# Patient Record
Sex: Female | Born: 1995 | Hispanic: No | Marital: Single | Smoking: Never smoker
Health system: Southern US, Community
[De-identification: ages and names within clinical notes are randomized; demographics above are authoritative.]

## PROBLEM LIST (undated history)

## (undated) DIAGNOSIS — F329 Major depressive disorder, single episode, unspecified: Secondary | ICD-10-CM

## (undated) DIAGNOSIS — F419 Anxiety disorder, unspecified: Secondary | ICD-10-CM

## (undated) DIAGNOSIS — R630 Anorexia: Secondary | ICD-10-CM

## (undated) DIAGNOSIS — F32A Depression, unspecified: Secondary | ICD-10-CM

---

## 2016-01-27 ENCOUNTER — Emergency Department
Admission: EM | Admit: 2016-01-27 | Discharge: 2016-01-27 | Disposition: A | Discharge status: Home / Self Care | Payer: Managed Care, Other (non HMO) | Attending: Emergency Medicine | Admitting: Emergency Medicine

## 2016-01-27 ENCOUNTER — Emergency Department: Payer: Managed Care, Other (non HMO)

## 2016-01-27 ENCOUNTER — Encounter: Payer: Self-pay | Admitting: Emergency Medicine

## 2016-01-27 DIAGNOSIS — B349 Viral infection, unspecified: Secondary | ICD-10-CM | POA: Diagnosis not present

## 2016-01-27 DIAGNOSIS — R509 Fever, unspecified: Secondary | ICD-10-CM | POA: Diagnosis present

## 2016-01-27 HISTORY — DX: Anorexia: R63.0

## 2016-01-27 HISTORY — DX: Major depressive disorder, single episode, unspecified: F32.9

## 2016-01-27 HISTORY — DX: Depression, unspecified: F32.A

## 2016-01-27 HISTORY — DX: Anxiety disorder, unspecified: F41.9

## 2016-01-27 LAB — COMPREHENSIVE METABOLIC PANEL
ALT: 15 U/L (ref 14–54)
ANION GAP: 9 (ref 5–15)
AST: 22 U/L (ref 15–41)
Albumin: 4.4 g/dL (ref 3.5–5.0)
Alkaline Phosphatase: 69 U/L (ref 38–126)
BUN: 10 mg/dL (ref 6–20)
CHLORIDE: 102 mmol/L (ref 101–111)
CO2: 23 mmol/L (ref 22–32)
Calcium: 9.4 mg/dL (ref 8.9–10.3)
Creatinine, Ser: 0.92 mg/dL (ref 0.44–1.00)
GFR calc Af Amer: >60 mL/min (ref >60)
Glucose, Bld: 131 mg/dL — ABNORMAL HIGH (ref 65–99)
POTASSIUM: 3.5 mmol/L (ref 3.5–5.1)
Sodium: 134 mmol/L — ABNORMAL LOW (ref 135–145)
Total Bilirubin: 1.2 mg/dL (ref 0.3–1.2)
Total Protein: 7.9 g/dL (ref 6.5–8.1)

## 2016-01-27 LAB — CBC
HEMATOCRIT: 42.9 % (ref 35.0–47.0)
HEMOGLOBIN: 14.7 g/dL (ref 12.0–16.0)
MCH: 28.7 pg (ref 26.0–34.0)
MCHC: 34.2 g/dL (ref 32.0–36.0)
MCV: 84 fL (ref 80.0–100.0)
Platelets: 210 10*3/uL (ref 150–440)
RBC: 5.1 MIL/uL (ref 3.80–5.20)
RDW: 12.9 % (ref 11.5–14.5)
WBC: 18.4 10*3/uL — AB (ref 3.6–11.0)

## 2016-01-27 LAB — URINALYSIS COMPLETE WITH MICROSCOPIC (ARMC ONLY)
Glucose, UA: NEGATIVE mg/dL
HGB URINE DIPSTICK: NEGATIVE
Nitrite: NEGATIVE
PH: 6 (ref 5.0–8.0)
Protein, ur: >500 mg/dL — AB
SPECIFIC GRAVITY, URINE: 1.035 — AB (ref 1.005–1.030)

## 2016-01-27 LAB — INFLUENZA PANEL BY PCR (TYPE A & B)
H1N1 flu by pcr: NOT DETECTED
INFLAPCR: NEGATIVE
INFLBPCR: NEGATIVE

## 2016-01-27 LAB — MONONUCLEOSIS SCREEN: Mono Screen: NEGATIVE

## 2016-01-27 LAB — PREGNANCY, URINE: Preg Test, Ur: NEGATIVE

## 2016-01-27 LAB — POCT RAPID STREP A: Streptococcus, Group A Screen (Direct): NEGATIVE

## 2016-01-27 LAB — LIPASE, BLOOD: LIPASE: 35 U/L (ref 11–51)

## 2016-01-27 MED ORDER — KETOROLAC TROMETHAMINE 30 MG/ML IJ SOLN
30.0000 mg | Freq: Once | INTRAMUSCULAR | Status: AC
  Administered 2016-01-27: 30 mg via INTRAVENOUS
  Filled 2016-01-27: qty 1

## 2016-01-27 MED ORDER — SODIUM CHLORIDE 0.9 % IV BOLUS (SEPSIS)
1000.0000 mL | Freq: Once | INTRAVENOUS | Status: AC
  Administered 2016-01-27: 1000 mL via INTRAVENOUS

## 2016-01-27 MED ORDER — PENTAFLUOROPROP-TETRAFLUOROETH EX AERO
INHALATION_SPRAY | CUTANEOUS | Status: AC
  Filled 2016-01-27: qty 30

## 2016-01-27 MED ORDER — IBUPROFEN 600 MG PO TABS
600.0000 mg | ORAL_TABLET | Freq: Once | ORAL | Status: AC
  Administered 2016-01-27: 600 mg via ORAL
  Filled 2016-01-27: qty 1

## 2016-01-27 MED ORDER — ONDANSETRON 4 MG PO TBDP
4.0000 mg | ORAL_TABLET | Freq: Once | ORAL | Status: AC | PRN
  Administered 2016-01-27: 4 mg via ORAL
  Filled 2016-01-27: qty 1

## 2016-01-27 MED ORDER — AZITHROMYCIN 250 MG PO TABS: ORAL_TABLET | ORAL | 0 refills | Status: AC

## 2016-01-27 MED ORDER — PENTAFLUOROPROP-TETRAFLUOROETH EX AERO
INHALATION_SPRAY | CUTANEOUS | Status: DC | PRN
  Administered 2016-01-27: 30 via TOPICAL

## 2016-01-27 NOTE — ED Notes (Signed)
MD Quigley at bedside. 

## 2016-01-27 NOTE — Discharge Instructions (Signed)
Return immediately if he develops an increasing headache, light bothers her eyes, persistent vomiting, midline neck stiffness, unusual rash or lesion, or any other new concerns.  Please take ibuprofen and/or Tylenol around-the-clock for body aches and fever. Drink plenty of fluids and take a multivitamin.  Please return immediately if condition worsens. Please contact her primary physician or the physician you were given for referral. If you have any specialist physicians involved in her treatment and plan please also contact them. Thank you for using [facility].

## 2016-01-27 NOTE — ED Triage Notes (Signed)
Pt says she began feeling bad last night with vomiting "bile" and fever; max temp at her dorm was 102.1; having general body aches, generalized abd pain; neck pain (able to tip chin to chest), general body aches; c/o sore throat; denies cough; pt tearful in triage; ibuprofen at 2pm, tylenol flu at 7pm

## 2016-01-27 NOTE — ED Provider Notes (Signed)
Time Seen: Approximately 2115 I have reviewed the triage notes  Chief Complaint: Fever; Nausea; Generalized Body Aches; and Abdominal Pain   History of Present Illness: Elaine Beard is a 20 y.o. female *patient presents with feeling bad the previous night and vomited up times one and some biliary type material. She had some diffuse body aches and checked her temperature at the dorm room and was 102.1. She's describing some general body aches. She denies any abdominal pain to this historian. She does state some neck pain and points mainly to the right lateral surface. She states her symptoms really started with a sore throat. She denies any productive cough or wheezing. She denies any flank pain dysuria or hematuria. Niacin any unusual rashes or lesions. No recent travel. No obvious tick bite or exposure tickborne disease. She states that there is numerous people ill on her for. She states the individual lives across the hall has mononucleosis.  Past Medical History:  Diagnosis Date  . Anorexia   . Anxiety   . Depression     There are no active problems to display for this patient.   History reviewed. No pertinent surgical history.  History reviewed. No pertinent surgical history.  Current Outpatient Rx  . Order #: [id number] Class: Historical Med  . Order #: [id number] Class: Historical Med  . Order #: [id number] Class: Print    Allergies:  Review of patient's allergies indicates no known allergies.  Family History: History reviewed. No pertinent family history.  Social History: Social History  Substance Use Topics  . Smoking status: Never Smoker  . Smokeless tobacco: Never Used  . Alcohol use Yes     Review of Systems:   10 point review of systems was performed and was otherwise negative:  Constitutional: Fever max is 102.1 Eyes: No visual disturbances. No photophobia ENT: Sore throat without any ear pain  Cardiac: No chest pain Respiratory: No shortness of  breath, wheezing, or stridor Abdomen: No current abdominal pain, mild nausea No diarrhea Endocrine: No weight loss, No night sweats Extremities: No peripheral edema, cyanosis Skin: No rashes, easy bruising Neurologic: No focal weakness, trouble with speech or swollowing. Patient denies any significant headache Urologic: No dysuria, Hematuria, or urinary frequency   Physical Exam:  ED Triage Vitals  Enc Vitals Group     BP 01/27/16 2003 120/68     Pulse Rate 01/27/16 2003 (!) 102     Resp 01/27/16 2003 20     Temp 01/27/16 2003 (!) 101.6 F (38.7 C)     Temp Source 01/27/16 2003 Oral     SpO2 01/27/16 2003 100 %     Weight 01/27/16 2004 160 lb (72.6 kg)     Height 01/27/16 2004 5\' 10"  (1.778 m)     Head Circumference --      Peak Flow --      Pain Score 01/27/16 2004 8     Pain Loc --      Pain Edu? --      Excl. in GC? --     General: Awake , Alert , and Oriented times 3; GCS 15 Head: Normal cephalic , atraumatic Eyes: Pupils equal , round, reactive to light. No photophobia Nose/Throat: No nasal drainage, patent upper airway with enlarged tonsils and some mild exudate bilaterally with a midline uvula and no peritonsillar abscesses. Neck: Supple, Full range of motion, No anterior adenopathy or palpable thyroid masses. No meningeal signs Lungs: Clear to ascultation without wheezes , rhonchi, or rales Heart: Regular  rate, regular rhythm without murmurs , gallops , or rubs Abdomen: Soft, non tender without rebound, guarding , or rigidity; bowel sounds positive and symmetric in all 4 quadrants. No organomegaly .        Extremities: 2 plus symmetric pulses. No edema, clubbing or cyanosis Neurologic: normal ambulation, Motor symmetric without deficits, sensory intact Skin: warm, dry, no rashes   Labs:   All laboratory work was reviewed including any pertinent negatives or positives listed below:  Labs Reviewed  COMPREHENSIVE METABOLIC PANEL - Abnormal; Notable for the  following:       Result Value   Sodium 134 (*)    Glucose, Bld 131 (*)    All other components within normal limits  CBC - Abnormal; Notable for the following:    WBC 18.4 (*)    All other components within normal limits  URINALYSIS COMPLETEWITH MICROSCOPIC ([facility] ONLY) - Abnormal; Notable for the following:    Color, Urine AMBER (*)    APPearance CLEAR (*)    Bilirubin Urine 2+ (*)    Ketones, ur 1+ (*)    Specific Gravity, Urine 1.035 (*)    Protein, ur >500 (*)    Leukocytes, UA TRACE (*)    Bacteria, UA FEW (*)    Squamous Epithelial / LPF 0-5 (*)    All other components within normal limits  CULTURE, GROUP A STREP ([facility])  LIPASE, BLOOD  INFLUENZA PANEL BY PCR (TYPE A & B, H1N1)  MONONUCLEOSIS SCREEN  PREGNANCY, URINE  POCT RAPID STREP A  Patient's influenza and viral testing is negative   Radiology: * Patient's chest x-ray is pending per radiology that is looked at the films there is some mild streaking posteriorly but no obvious focal infiltrate  I personally reviewed the radiologic studies    ED Course:  Patient's stay here showed symptomatic improvement with IV fluid bolus. She had some mild nausea on arrival which was cleared with Zofran. I felt was unlikely the patient had meningitis and reviewed the symptoms with her at the bedside and we both agree that a lumbar puncture is not necessary at this time though advised her to have a low threshold to return to the emergency department if she develops a global headache, photophobia, etc. Source of her fever is unknown most likely a viral illness that she does have some mild exudates on both of her tonsils and her symptoms started with a sore throat. Since she has some mild streaking posteriorly on her x-ray I felt we could start her on a Zithromax which will cover both for early community acquired pneumonia and pharyngitis. She was advised take over-the-counter Tylenol and/or Motrin for pain and fever. Clinical Course      Assessment: Viral syndrome  Final Clinical Impression  Final diagnoses:  Viral illness     Plan:  Outpatient Patient was advised to return immediately if condition worsens. Patient was advised to follow up with their primary care physician or other specialized physicians involved in their outpatient care. The patient and/or family member/power of attorney had laboratory results reviewed at the bedside. All questions and concerns were addressed and appropriate discharge instructions were distributed by the nursing staff.             Jennye MoccasinBrian S Quigley, MD 01/27/16 [phone]

## 2016-01-27 NOTE — ED Notes (Signed)
Reviewed d/c instructions, follow-up care and prescription with pt. Pt verbalized understanding 

## 2016-01-27 NOTE — ED Notes (Addendum)
PT c/o generalized abdominal pain, neck pain, head ache, weakness, dizziness, back pain, sore throat, N/V and fever/chills. Pt denies diarrhea. PT reports tmax of 102.1 at home.

## 2016-01-29 LAB — CULTURE, GROUP A STREP (THRC)

## 2017-08-12 ENCOUNTER — Emergency Department: Payer: Managed Care, Other (non HMO)

## 2017-08-12 ENCOUNTER — Other Ambulatory Visit: Payer: Self-pay

## 2017-08-12 ENCOUNTER — Emergency Department
Admission: EM | Admit: 2017-08-12 | Discharge: 2017-08-12 | Disposition: A | Discharge status: Home / Self Care | Payer: Managed Care, Other (non HMO) | Attending: Emergency Medicine | Admitting: Emergency Medicine

## 2017-08-12 DIAGNOSIS — R102 Pelvic and perineal pain: Secondary | ICD-10-CM | POA: Insufficient documentation

## 2017-08-12 LAB — COMPREHENSIVE METABOLIC PANEL
ALT: 19 U/L (ref 14–54)
AST: 24 U/L (ref 15–41)
Albumin: 4.5 g/dL (ref 3.5–5.0)
Alkaline Phosphatase: 73 U/L (ref 38–126)
Anion gap: 6 (ref 5–15)
BUN: 8 mg/dL (ref 6–20)
CO2: 25 mmol/L (ref 22–32)
Calcium: 9.1 mg/dL (ref 8.9–10.3)
Chloride: 106 mmol/L (ref 101–111)
Creatinine, Ser: 0.74 mg/dL (ref 0.44–1.00)
GFR calc Af Amer: >60 mL/min (ref >60)
GFR calc non Af Amer: >60 mL/min (ref >60)
Glucose, Bld: 94 mg/dL (ref 65–99)
Potassium: 3.9 mmol/L (ref 3.5–5.1)
Sodium: 137 mmol/L (ref 135–145)
Total Bilirubin: 0.3 mg/dL (ref 0.3–1.2)
Total Protein: 7 g/dL (ref 6.5–8.1)

## 2017-08-12 LAB — CBC WITH DIFFERENTIAL/PLATELET
Basophils Absolute: 0.1 10*3/uL (ref 0–0.1)
Basophils Relative: 1 %
Eosinophils Absolute: 0.2 10*3/uL (ref 0–0.7)
Eosinophils Relative: 2 %
HEMATOCRIT: 38.9 % (ref 35.0–47.0)
HEMOGLOBIN: 13 g/dL (ref 12.0–16.0)
Lymphocytes Relative: 24 %
Lymphs Abs: 2 10*3/uL (ref 1.0–3.6)
MCH: 26.8 pg (ref 26.0–34.0)
MCHC: 33.3 g/dL (ref 32.0–36.0)
MCV: 80.6 fL (ref 80.0–100.0)
MONOS PCT: 8 %
Monocytes Absolute: 0.7 10*3/uL (ref 0.2–0.9)
NEUTROS ABS: 5.5 10*3/uL (ref 1.4–6.5)
Neutrophils Relative %: 65 %
Platelets: 312 10*3/uL (ref 150–440)
RBC: 4.83 MIL/uL (ref 3.80–5.20)
RDW: 13.7 % (ref 11.5–14.5)
WBC: 8.4 10*3/uL (ref 3.6–11.0)

## 2017-08-12 LAB — POCT PREGNANCY, URINE: PREG TEST UR: NEGATIVE

## 2017-08-12 NOTE — ED Provider Notes (Addendum)
[facility] Emergency Department Provider Note  ____________________________________________  Time seen: Approximately 7:18 PM  I have reviewed the triage vital signs and the nursing notes.   HISTORY  Chief Complaint Pelvic Pain    HPI Elaine Beard is a 22 y.o. female with a history of depression and irregular periods who comes to the ED complaining of pelvic pain, worse in the left, worse during her period, no alleviating factors. Her menstrual cycles are irregular in timing and heaviness. Currently she is on her cycle. Pain is moderate in intensity, sharp, nonradiating. No fever or vomiting or other complaints. No abnormal discharge.      Past Medical History:  Diagnosis Date  . Anorexia   . Anxiety   . Depression      There are no active problems to display for this patient.    History reviewed. No pertinent surgical history.   Prior to Admission medications   Medication Sig Start Date End Date Taking? Authorizing Provider  FLUoxetine (PROZAC) 20 MG capsule Take 20 mg by mouth daily.    [provider]  norethindrone-ethinyl estradiol (JUNEL FE,GILDESS FE,LOESTRIN FE) 1-20 MG-MCG tablet Take 1 tablet by mouth daily.    [provider]     Allergies Patient has no known allergies.   No family history on file.  Social History Social History   Tobacco Use  . Smoking status: Never Smoker  . Smokeless tobacco: Never Used  Substance Use Topics  . Alcohol use: Yes    Comment: weekends  . Drug use: No    Review of Systems  Constitutional:   No fever or chills.  ENT:   No sore throat. No rhinorrhea. Cardiovascular:   No chest pain or syncope. Respiratory:   No dyspnea or cough. Gastrointestinal:   positive as above for pelvic pain without vomiting and diarrhea.  Musculoskeletal:   Negative for focal pain or swelling All other systems reviewed and are negative except as documented above in ROS and  HPI.  ____________________________________________   PHYSICAL EXAM:  VITAL SIGNS: ED Triage Vitals  Enc Vitals Group     BP 08/12/17 1758 117/75     Pulse Rate 08/12/17 1758 74     Resp 08/12/17 1758 18     Temp 08/12/17 1758 98.6 F (37 C)     Temp Source 08/12/17 1758 Oral     SpO2 08/12/17 1758 99 %     Weight 08/12/17 1758 155 lb (70.3 kg)     Height 08/12/17 1758  (1.778 m)     Head Circumference --      Peak Flow --      Pain Score 08/12/17 1804 8     Pain Loc --      Pain Edu? --      Excl. in GC? --     Vital signs reviewed, nursing assessments reviewed.   Constitutional:   Alert and oriented. Well appearing and in no distress. Eyes:   Conjunctivae are normal. EOMI.  ENT      Head:   Normocephalic and atraumatic.    Gastrointestinal:   Soft with left lower quadrant tenderness. Non distended. There is no CVA tenderness.  No rebound, rigidity, or guarding.  Musculoskeletal:   Normal range of motion in all extremities. No joint effusions.  No lower extremity tenderness.  No edema. Neurologic:   Normal speech and language.  Motor grossly intact. No acute focal neurologic deficits are appreciated.  Skin:    Skin  is warm, dry and intact. No rash noted.  No petechiae, purpura, or bullae.  ____________________________________________    LABS (pertinent positives/negatives) (all labs ordered are listed, but only abnormal results are displayed) Labs Reviewed  CBC WITH DIFFERENTIAL/PLATELET  COMPREHENSIVE METABOLIC PANEL  POC URINE PREG, ED  POCT PREGNANCY, URINE   ____________________________________________   EKG    ____________________________________________    RADIOLOGY  No results found.  ____________________________________________   PROCEDURES Procedures  ____________________________________________  DIFFERENTIAL DIAGNOSIS   ovarian cyst, endometriosis, uterine fibroid  CLINICAL IMPRESSION / ASSESSMENT AND PLAN / ED  COURSE  Pertinent labs & imaging results that were available during my care of the patient were reviewed by me and considered in my medical decision making (see chart for details).    patient well-appearing no acute distress, low suspicion for STI PID TOA or torsion. Due to her chronic recurrent symptoms, I'll get a pelvic ultrasound today for further evaluation, particularly since she is from out of town, does not have a provider in this area, unable to obtain expeditious follow-up. If there are no severe findings patient will be suitable for discharge home to await follow-up with local gynecology.   ----------------------------------------- 8:33 PM on 08/12/2017 -----------------------------------------  Ultrasound normal. Discharge home to follow up With gynecology. NSAIDs as needed.     ____________________________________________   FINAL CLINICAL IMPRESSION(S) / ED DIAGNOSES    Final diagnoses:  Pelvic pain in female     ED Discharge Orders    None      Portions of this note were generated with dragon dictation software. Dictation errors may occur despite best attempts at proofreading.    Sharman Cheek, MD 08/12/17 [name]    Sharman Cheek, MD 08/12/17 2034

## 2017-08-12 NOTE — ED Triage Notes (Signed)
Pt c/o pelvic pain - she went to urgent care and they brought her to the ED - pt states that she also has issues with her period and has pelvic pain for the last year - she got an IUD last August and the cramping has gotten worse since then - her OB/GYN is concerned that the IUD has "implanted"

## 2017-08-12 NOTE — Discharge Instructions (Addendum)
Your pelvic ultrasound today was normal.  We are unable to identify a cause of your symptoms today. Please follow up with gynecology when able for further evaluation. Take NSAIDS such as ibuprofen or naproxen as needed for pain control.  Ultrasound report:  US Transvaginal Non-ob  Result Date: 08/12/2017 CLINICAL DATA:  Pelvic pain and irregular menstrual cycles. Indwelling IUD. EXAM: TRANSABDOMINAL AND TRANSVAGINAL ULTRASOUND OF PELVIS TECHNIQUE: Both transabdominal and transvaginal ultrasound examinations of the pelvis were performed. Transabdominal technique was performed for global imaging of the pelvis including uterus, ovaries, adnexal regions, and pelvic cul-de-sac. It was necessary to proceed with endovaginal exam following the transabdominal exam to visualize the uterus and ovaries. COMPARISON:  None FINDINGS: Uterus Measurements: 5.9 x 3.5 x 4.6 cm. No fibroids or other mass visualized. Endometrium Thickness: 7 mm.  IUD localizes to the endometrial cavity. Right ovary Measurements: 2.8 x 1.8 x 2.6 cm. Normal appearance/no adnexal mass. Normal follicles present. Left ovary Measurements: 2.5 x 1.4 x 1.5 cm. Normal appearance/no adnexal mass. Normal follicles present. Other findings No abnormal free fluid. IMPRESSION: Normal pelvic ultrasound. IUD appropriately positioned in the endometrial cavity. Electronically Signed   By: Irish Lack M.D.   On: 08/12/2017 20:26   US Pelvis Complete  Result Date: 08/12/2017 CLINICAL DATA:  Pelvic pain and irregular menstrual cycles. Indwelling IUD. EXAM: TRANSABDOMINAL AND TRANSVAGINAL ULTRASOUND OF PELVIS TECHNIQUE: Both transabdominal and transvaginal ultrasound examinations of the pelvis were performed. Transabdominal technique was performed for global imaging of the pelvis including uterus, ovaries, adnexal regions, and pelvic cul-de-sac. It was necessary to proceed with endovaginal exam following the transabdominal exam to visualize the uterus and ovaries.  COMPARISON:  None FINDINGS: Uterus Measurements: 5.9 x 3.5 x 4.6 cm. No fibroids or other mass visualized. Endometrium Thickness: 7 mm.  IUD localizes to the endometrial cavity. Right ovary Measurements: 2.8 x 1.8 x 2.6 cm. Normal appearance/no adnexal mass. Normal follicles present. Left ovary Measurements: 2.5 x 1.4 x 1.5 cm. Normal appearance/no adnexal mass. Normal follicles present. Other findings No abnormal free fluid. IMPRESSION: Normal pelvic ultrasound. IUD appropriately positioned in the endometrial cavity. Electronically Signed   By: Irish Lack M.D.   On: 08/12/2017 20:26

## 2017-08-12 NOTE — ED Notes (Signed)
Informed RN that patient has been roomed and is ready for evaluation.  Patient in NAD at this time and call bell placed within reach.   

## 2018-05-28 ENCOUNTER — Other Ambulatory Visit: Payer: Self-pay | Admitting: Gastroenterology

## 2018-05-28 DIAGNOSIS — R1010 Upper abdominal pain, unspecified: Secondary | ICD-10-CM

## 2018-05-31 ENCOUNTER — Ambulatory Visit
Admission: RE | Admit: 2018-05-31 | Discharge: 2018-05-31 | Disposition: A | Discharge status: Home / Self Care | Payer: Managed Care, Other (non HMO) | Source: Ambulatory Visit | Attending: Gastroenterology | Admitting: Gastroenterology

## 2018-05-31 DIAGNOSIS — R1010 Upper abdominal pain, unspecified: Secondary | ICD-10-CM

## 2021-02-05 IMAGING — US US ABDOMEN LIMITED
1 series · 14 of 25 positions shown · non-contrast
Comparison: None.

CLINICAL DATA: Upper abdominal pain

EXAM:
ULTRASOUND ABDOMEN LIMITED RIGHT UPPER QUADRANT

[Series 1: us abdomen limited · 0.23mm/px · 14 of 35 slices shown]
[im 1/35]
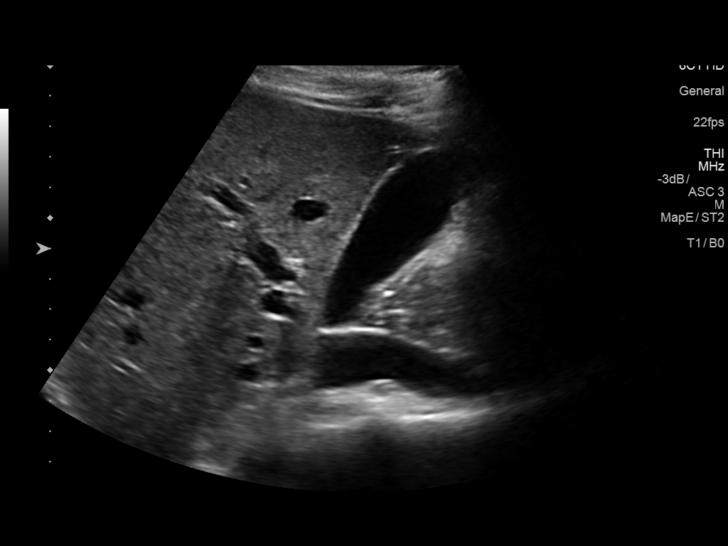
[im 3/35]
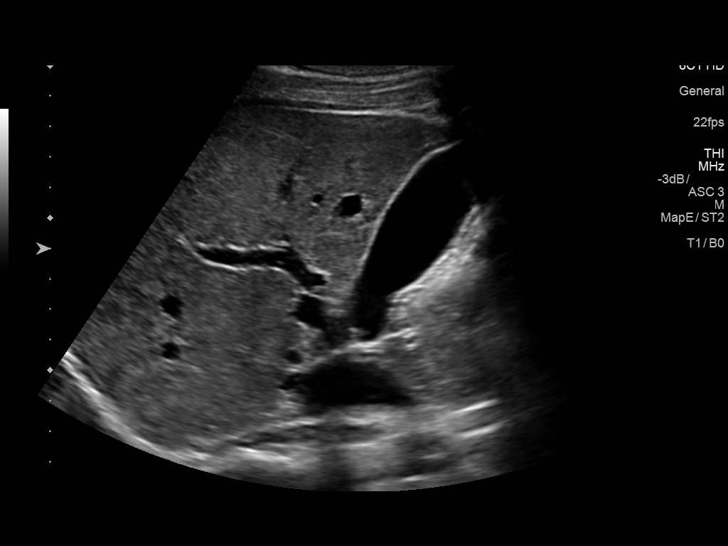
[im 6/35]
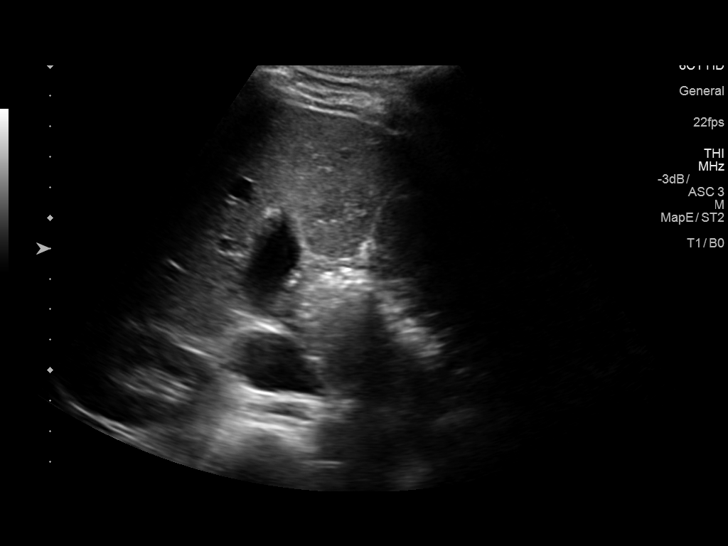
[im 9/35]
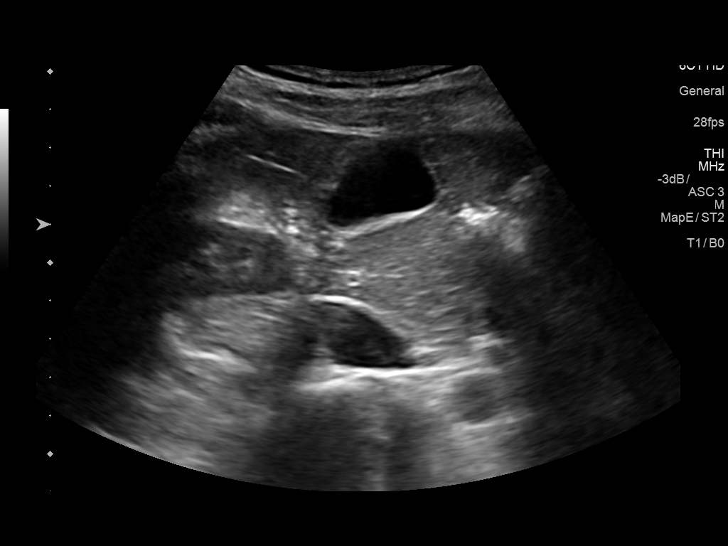
[im 12/35]
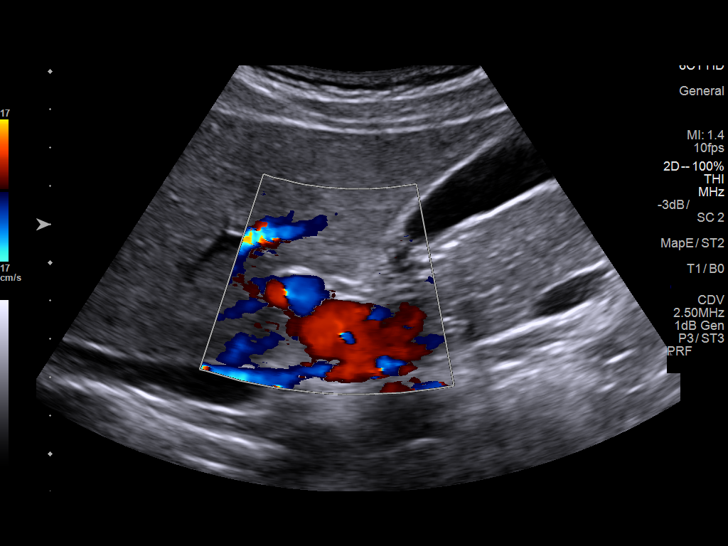
[im 13/35]
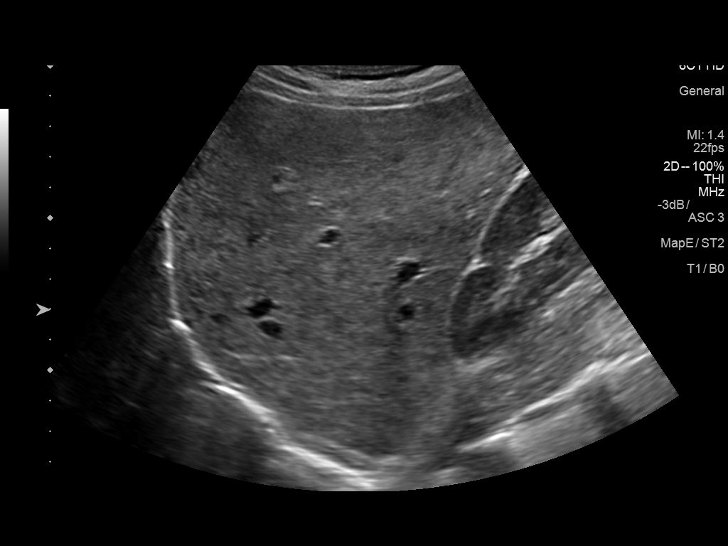
[im 16/35]
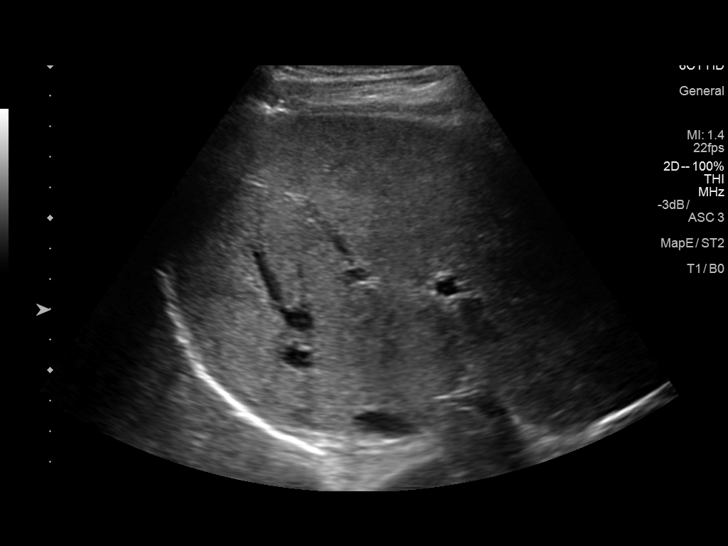
[im 19/35]
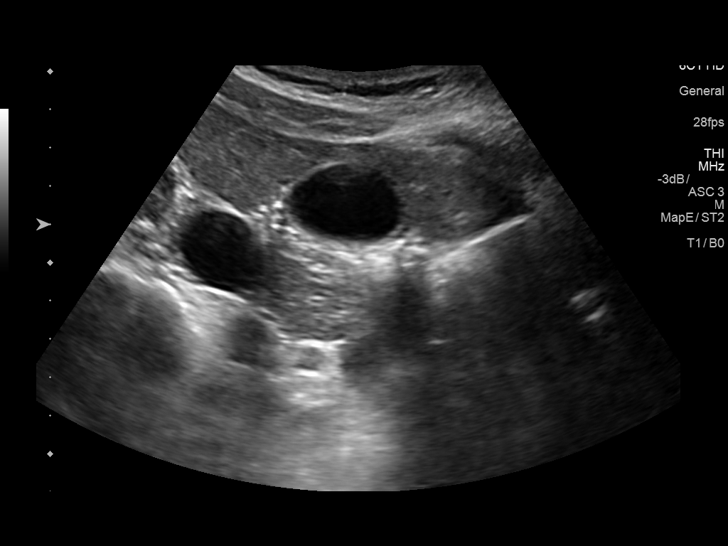
[im 22/35]
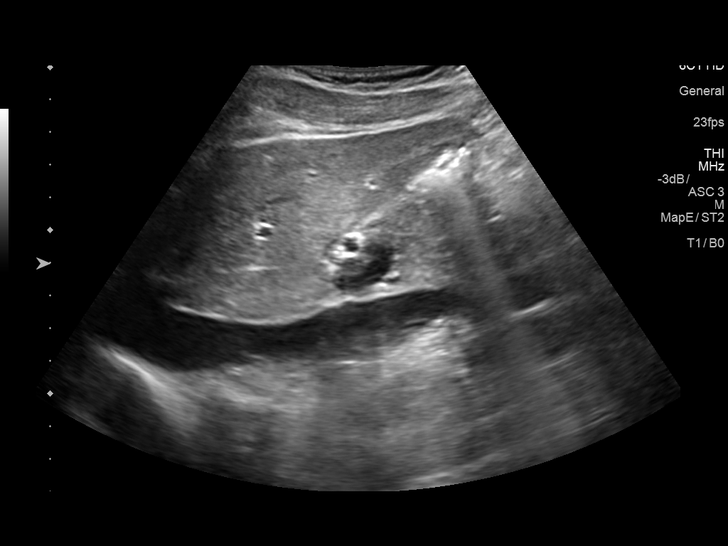
[im 23/35]
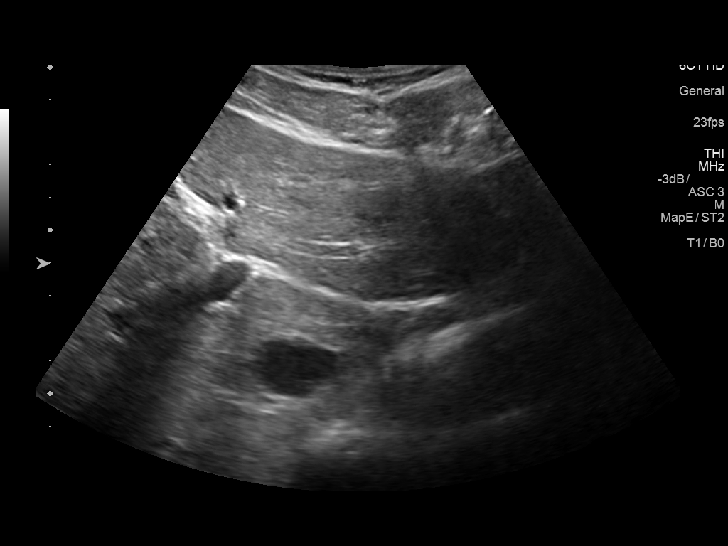
[im 26/35]
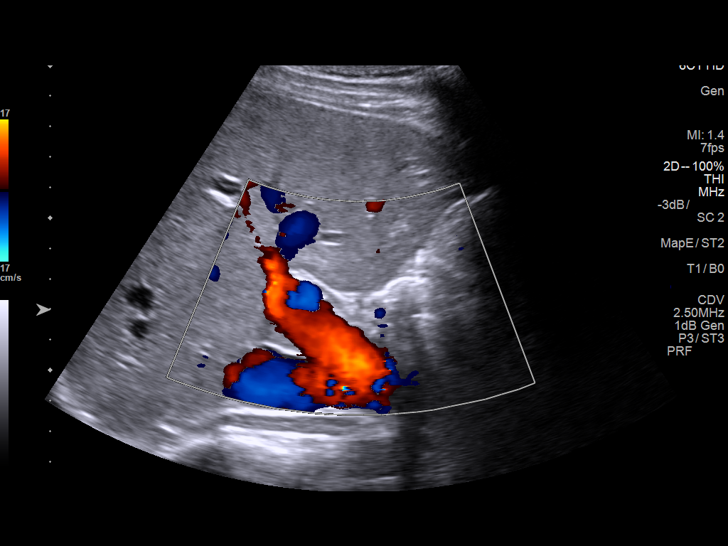
[im 29/35]
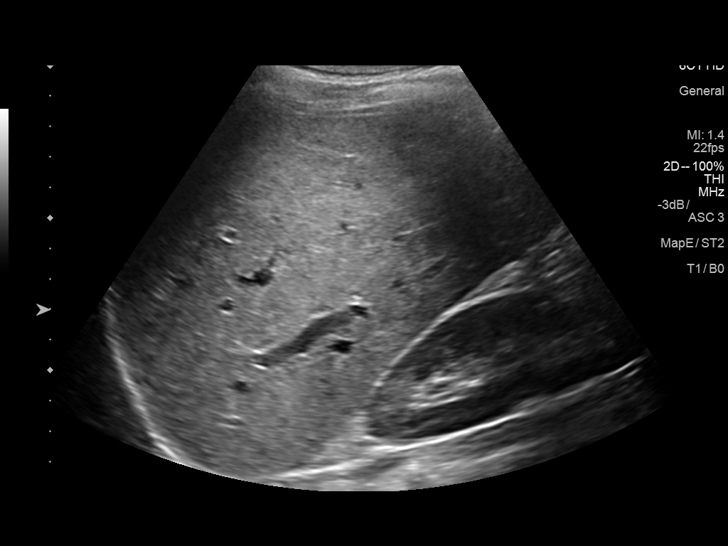
[im 32/35]
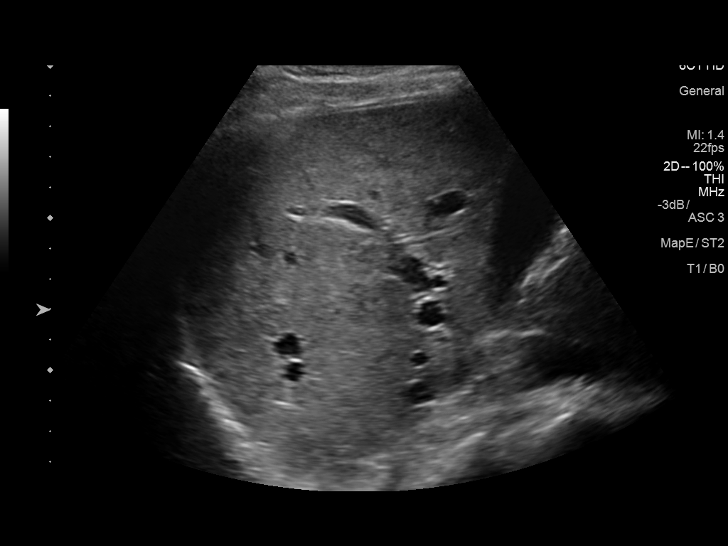
[im 35/35]
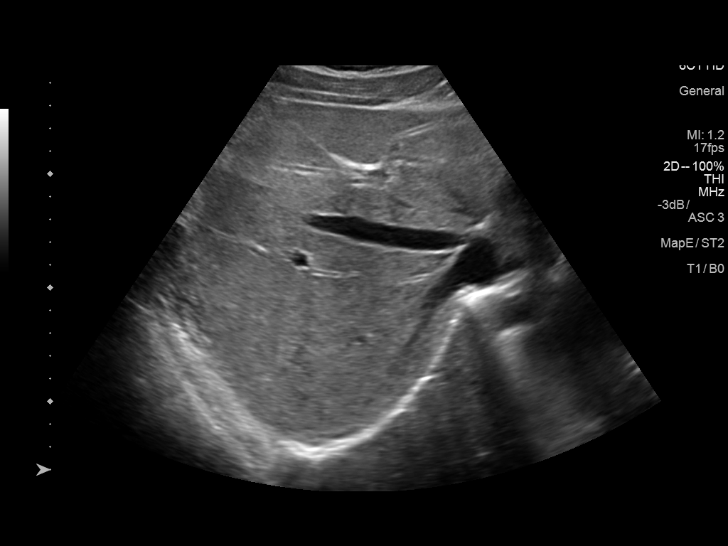

[14 of 25 positions shown; findings below may reference images not displayed]

FINDINGS: Gallbladder:

No gallstones or wall thickening visualized. There is no
pericholecystic fluid. No sonographic Murphy sign noted by
sonographer.

Common bile duct:

Diameter: 3 mm. No intrahepatic or extrahepatic biliary duct
dilatation.

Liver:

No focal lesion identified. Within normal limits in parenchymal
echogenicity. Portal vein is patent on color Doppler imaging with
normal direction of blood flow towards the liver.
IMPRESSION: Study within normal limits.
